# Patient Record
Sex: Male | Born: 1970 | Race: White | Hispanic: No | Marital: Married | Smoking: Former smoker
Health system: Southern US, Community
[De-identification: ages and names within clinical notes are randomized; demographics above are authoritative.]

---

## 2013-06-15 ENCOUNTER — Encounter (HOSPITAL_COMMUNITY): Payer: Self-pay | Admitting: Emergency Medicine

## 2013-06-15 ENCOUNTER — Emergency Department (HOSPITAL_COMMUNITY): Payer: BC Managed Care – PPO

## 2013-06-15 ENCOUNTER — Emergency Department (HOSPITAL_COMMUNITY)
Admission: EM | Admit: 2013-06-15 | Discharge: 2013-06-15 | Disposition: A | Discharge status: Home / Self Care | Payer: BC Managed Care – PPO | Attending: Emergency Medicine | Admitting: Emergency Medicine

## 2013-06-15 DIAGNOSIS — Z87891 Personal history of nicotine dependence: Secondary | ICD-10-CM | POA: Insufficient documentation

## 2013-06-15 DIAGNOSIS — S060X1A Concussion with loss of consciousness of 30 minutes or less, initial encounter: Secondary | ICD-10-CM | POA: Insufficient documentation

## 2013-06-15 DIAGNOSIS — S060X9A Concussion with loss of consciousness of unspecified duration, initial encounter: Secondary | ICD-10-CM

## 2013-06-15 DIAGNOSIS — Y9389 Activity, other specified: Secondary | ICD-10-CM | POA: Insufficient documentation

## 2013-06-15 DIAGNOSIS — S060XAA Concussion with loss of consciousness status unknown, initial encounter: Secondary | ICD-10-CM

## 2013-06-15 DIAGNOSIS — Y92410 Unspecified street and highway as the place of occurrence of the external cause: Secondary | ICD-10-CM | POA: Insufficient documentation

## 2013-06-15 LAB — CBC WITH DIFFERENTIAL/PLATELET
Basophils Absolute: 0 10*3/uL (ref 0.0–0.1)
Basophils Relative: 0 % (ref 0–1)
EOS PCT: 1 % (ref 0–5)
Eosinophils Absolute: 0.1 10*3/uL (ref 0.0–0.7)
HCT: 45 % (ref 39.0–52.0)
Hemoglobin: 16.2 g/dL (ref 13.0–17.0)
LYMPHS PCT: 22 % (ref 12–46)
Lymphs Abs: 1.6 10*3/uL (ref 0.7–4.0)
MCH: 31.8 pg (ref 26.0–34.0)
MCHC: 36 g/dL (ref 30.0–36.0)
MCV: 88.2 fL (ref 78.0–100.0)
MONO ABS: 0.6 10*3/uL (ref 0.1–1.0)
MONOS PCT: 9 % (ref 3–12)
Neutro Abs: 4.8 10*3/uL (ref 1.7–7.7)
Neutrophils Relative %: 68 % (ref 43–77)
Platelets: 167 10*3/uL (ref 150–400)
RBC: 5.1 MIL/uL (ref 4.22–5.81)
RDW: 12.6 % (ref 11.5–15.5)
WBC: 7 10*3/uL (ref 4.0–10.5)

## 2013-06-15 LAB — BASIC METABOLIC PANEL
BUN: 15 mg/dL (ref 6–23)
CALCIUM: 9.2 mg/dL (ref 8.4–10.5)
CO2: 23 mEq/L (ref 19–32)
Chloride: 104 mEq/L (ref 96–112)
Creatinine, Ser: 1.18 mg/dL (ref 0.50–1.35)
GFR calc Af Amer: 85 mL/min — ABNORMAL LOW (ref >90)
GFR calc non Af Amer: 74 mL/min — ABNORMAL LOW (ref >90)
Glucose, Bld: 121 mg/dL — ABNORMAL HIGH (ref 70–99)
Potassium: 3.7 mEq/L (ref 3.7–5.3)
Sodium: 142 mEq/L (ref 137–147)

## 2013-06-15 LAB — CARBOXYHEMOGLOBIN
Carboxyhemoglobin: 5 % (ref 0.5–1.5)
Methemoglobin: 1 % (ref 0.0–1.5)
O2 SAT: 47.3 %
Total hemoglobin: 16 g/dL (ref 13.5–18.0)

## 2013-06-15 MED ORDER — IOHEXOL 300 MG/ML  SOLN
100.0000 mL | Freq: Once | INTRAMUSCULAR | Status: AC | PRN
  Administered 2013-06-15: 100 mL via INTRAVENOUS

## 2013-06-15 MED ORDER — SODIUM CHLORIDE 0.9 % IV BOLUS (SEPSIS)
1000.0000 mL | Freq: Once | INTRAVENOUS | Status: AC
  Administered 2013-06-15: 1000 mL via INTRAVENOUS

## 2013-06-15 MED ORDER — OXYCODONE-ACETAMINOPHEN 5-325 MG PO TABS: 1.0000 | ORAL_TABLET | ORAL | Status: AC | PRN
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
## 2013-06-15 NOTE — ED Notes (Signed)
PT has repetitive questions.

## 2013-06-15 NOTE — ED Notes (Signed)
Called CT and Pt will be going soon.

## 2013-06-15 NOTE — ED Notes (Signed)
Reported repetitive questions to Dr. Gwendolyn GrantWalden, no changes in orders.  Will continue to monitor
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
## 2017-07-03 DIAGNOSIS — T1502XD Foreign body in cornea, left eye, subsequent encounter: Secondary | ICD-10-CM | POA: Diagnosis not present

## 2017-07-09 DIAGNOSIS — T1502XD Foreign body in cornea, left eye, subsequent encounter: Secondary | ICD-10-CM | POA: Diagnosis not present

## 2018-05-31 DIAGNOSIS — Z0000 Encounter for general adult medical examination without abnormal findings: Secondary | ICD-10-CM | POA: Diagnosis not present

## 2018-05-31 DIAGNOSIS — M109 Gout, unspecified: Secondary | ICD-10-CM | POA: Diagnosis not present

## 2020-03-15 DIAGNOSIS — M109 Gout, unspecified: Secondary | ICD-10-CM | POA: Diagnosis not present

## 2020-03-15 DIAGNOSIS — Z2821 Immunization not carried out because of patient refusal: Secondary | ICD-10-CM | POA: Diagnosis not present

## 2020-03-15 DIAGNOSIS — Z6836 Body mass index (BMI) 36.0-36.9, adult: Secondary | ICD-10-CM | POA: Diagnosis not present

## 2020-05-10 DIAGNOSIS — T1501XA Foreign body in cornea, right eye, initial encounter: Secondary | ICD-10-CM | POA: Diagnosis not present

## 2020-11-23 DIAGNOSIS — N23 Unspecified renal colic: Secondary | ICD-10-CM | POA: Diagnosis not present

## 2020-12-01 DIAGNOSIS — R972 Elevated prostate specific antigen [PSA]: Secondary | ICD-10-CM | POA: Diagnosis not present

## 2020-12-01 DIAGNOSIS — N401 Enlarged prostate with lower urinary tract symptoms: Secondary | ICD-10-CM | POA: Diagnosis not present

## 2020-12-01 DIAGNOSIS — N200 Calculus of kidney: Secondary | ICD-10-CM | POA: Diagnosis not present

## 2022-05-03 DIAGNOSIS — Z0000 Encounter for general adult medical examination without abnormal findings: Secondary | ICD-10-CM | POA: Diagnosis not present

## 2022-05-03 DIAGNOSIS — Z1331 Encounter for screening for depression: Secondary | ICD-10-CM | POA: Diagnosis not present

## 2022-05-10 DIAGNOSIS — K420 Umbilical hernia with obstruction, without gangrene: Secondary | ICD-10-CM | POA: Diagnosis not present

## 2022-05-18 DIAGNOSIS — K420 Umbilical hernia with obstruction, without gangrene: Secondary | ICD-10-CM | POA: Diagnosis not present

## 2022-05-18 DIAGNOSIS — E785 Hyperlipidemia, unspecified: Secondary | ICD-10-CM | POA: Diagnosis not present

## 2022-05-18 DIAGNOSIS — Z79899 Other long term (current) drug therapy: Secondary | ICD-10-CM | POA: Diagnosis not present

## 2022-05-26 DIAGNOSIS — R972 Elevated prostate specific antigen [PSA]: Secondary | ICD-10-CM | POA: Diagnosis not present

## 2022-05-26 DIAGNOSIS — N401 Enlarged prostate with lower urinary tract symptoms: Secondary | ICD-10-CM | POA: Diagnosis not present

## 2022-07-14 DIAGNOSIS — N401 Enlarged prostate with lower urinary tract symptoms: Secondary | ICD-10-CM | POA: Diagnosis not present

## 2022-07-14 DIAGNOSIS — R972 Elevated prostate specific antigen [PSA]: Secondary | ICD-10-CM | POA: Diagnosis not present

## 2022-08-29 DIAGNOSIS — R972 Elevated prostate specific antigen [PSA]: Secondary | ICD-10-CM | POA: Diagnosis not present

## 2022-08-29 DIAGNOSIS — N401 Enlarged prostate with lower urinary tract symptoms: Secondary | ICD-10-CM | POA: Diagnosis not present

## 2022-09-06 DIAGNOSIS — N401 Enlarged prostate with lower urinary tract symptoms: Secondary | ICD-10-CM | POA: Diagnosis not present

## 2022-09-06 DIAGNOSIS — R972 Elevated prostate specific antigen [PSA]: Secondary | ICD-10-CM | POA: Diagnosis not present

## 2023-01-08 DIAGNOSIS — R972 Elevated prostate specific antigen [PSA]: Secondary | ICD-10-CM | POA: Diagnosis not present

## 2023-01-08 DIAGNOSIS — N400 Enlarged prostate without lower urinary tract symptoms: Secondary | ICD-10-CM | POA: Diagnosis not present

## 2023-01-08 DIAGNOSIS — M109 Gout, unspecified: Secondary | ICD-10-CM | POA: Diagnosis not present

## 2023-05-11 DIAGNOSIS — N400 Enlarged prostate without lower urinary tract symptoms: Secondary | ICD-10-CM | POA: Diagnosis not present

## 2023-05-11 DIAGNOSIS — M109 Gout, unspecified: Secondary | ICD-10-CM | POA: Diagnosis not present

## 2023-05-11 DIAGNOSIS — R972 Elevated prostate specific antigen [PSA]: Secondary | ICD-10-CM | POA: Diagnosis not present
